# Patient Record
Sex: Male | Born: 1975 | Race: White | Hispanic: No | Marital: Single
Health system: Southern US, Community
[De-identification: ages and names within clinical notes are randomized; demographics above are authoritative.]

---

## 2014-06-26 ENCOUNTER — Emergency Department: Payer: Self-pay | Admitting: Emergency Medicine

## 2014-07-06 ENCOUNTER — Ambulatory Visit: Payer: Self-pay | Admitting: Orthopedic Surgery

## 2014-08-30 ENCOUNTER — Encounter
Admit: 2014-08-30 | Disposition: A | Discharge status: Home / Self Care | Payer: Self-pay | Attending: Orthopedic Surgery | Admitting: Orthopedic Surgery

## 2014-09-19 NOTE — Op Note (Signed)
PATIENT NAME:  Daniel Webb, Nilson M MR#:  [id number] DATE OF BIRTH:  Sep 01, 1975  DATE OF PROCEDURE:  07/06/2014  PREOPERATIVE DIAGNOSIS: Right long finger proximal phalanx fracture, comminuted, displaced.   POSTOPERATIVE DIAGNOSIS: Right long finger proximal phalanx fracture, comminuted, displaced, intra-articular.   PROCEDURE: Open reduction internal fixation, right long finger proximal phalanx.   ANESTHESIA: General.   SURGEON: Leitha SchullerMichael J. Lavone Barrientes, MD   DESCRIPTION OF PROCEDURE: The patient was brought to the operating room and after adequate general anesthesia was obtained, the right arm was prepped and draped in the usual sterile fashion with a tourniquet applied to the upper arm. After patient identification and timeout procedures were completed, the tourniquet was raised and a curvilinear incision was made over the proximal phalanx and MCP joints. The extensor tendon and hood were incised in the midline to expose the fracture. The fracture had additional fracture line not evident on preoperative x-rays. The dorsal proximal fragment had a fracture at the base with just a small metaphyseal piece associated with the articular surface. The fracture was exposed and clot removed. The fracture had a rotational deformity that was quite difficult to get a perfect reduction. It took several attempts at getting reduction. Eventually, with adequate reduction, reduction clamp was applied and a K wire inserted. Subsequently, 2 K wires to maintain alignment with acceptable position of the fragments. A small T-plate from the Biomet hand tray, 1.5 mm, was then applied. The proximal and distal screws were placed and then sequential filling of the locking screws gave essentially anatomic alignment in all planes with less than a millimeter of displacement in any direction. After the plate was applied and K wires had been removed, the finger appeared to have appropriate rotation and longitudinal orientation. The joint  surface appeared intact. The wound was thoroughly irrigated. The extensor hood was repaired using a running 4-0 Ethibond. The skin was closed with simple interrupted 4-0 nylon. A digital block with 20 mL of 0.5% Sensorcaine was given. The wound was then dressed with Xeroform, 4 x 4's, fluffs for a dressing, and then the hand was splinted with a dorsal splint holding the MCP joint in flexion of all fingers along with flexion immediately postoperative.  Tourniquet was let down. The patient was sent to the recovery room in stable condition.   ESTIMATED BLOOD LOSS: Minimal.   COMPLICATIONS: None.   SPECIMEN: None.   IMPLANTS: Biomet 1.5 mm locking hand small T-plate with multiple screws.   TOURNIQUET TIME: 99 minutes at 250 mmHg.    ____________________________ Leitha SchullerMichael J. Londa Mackowski, MD mjm:bm D: 07/06/2014 19:21:42 ET T: 07/07/2014 01:50:19 ET JOB#: [id number]  cc: Leitha SchullerMichael J. Haivyn Oravec, MD, <Dictator> Leitha SchullerMICHAEL J Woodrow Dulski MD ELECTRONICALLY SIGNED 07/07/2014 8:34

## 2014-09-23 ENCOUNTER — Encounter: Payer: Self-pay | Admitting: Occupational Therapy

## 2014-09-23 ENCOUNTER — Ambulatory Visit: Payer: No Typology Code available for payment source | Attending: Orthopedic Surgery | Admitting: Occupational Therapy

## 2014-09-23 DIAGNOSIS — R279 Unspecified lack of coordination: Secondary | ICD-10-CM | POA: Diagnosis not present

## 2014-09-23 DIAGNOSIS — R609 Edema, unspecified: Secondary | ICD-10-CM | POA: Insufficient documentation

## 2014-09-23 DIAGNOSIS — M79641 Pain in right hand: Secondary | ICD-10-CM | POA: Diagnosis not present

## 2014-09-23 DIAGNOSIS — M25641 Stiffness of right hand, not elsewhere classified: Secondary | ICD-10-CM | POA: Insufficient documentation

## 2014-09-23 DIAGNOSIS — M6281 Muscle weakness (generalized): Secondary | ICD-10-CM

## 2014-09-23 NOTE — Patient Instructions (Signed)
Pt was ed on HEP - for PROM of PIP extention , AROM into table , rolling putty and donut for digits extnetion  PROM of MC flexion , PROM dip/pip hold, PROM composite flexion  PUtty green full fist   Scar pad for night time - Cica care  Coban for ngiht time 3rd digit -to decrease edema  Provided silicon digi sleeve for daytime use of 3rd digit to decrease scar - and increase flexion of PIP  Buddy strap during day - alternate with 2nd and 4th digits  Scar massage - and use piece of coban for extra traction on scar

## 2014-09-23 NOTE — Therapy (Signed)
[facility] [address] [place], [place], [address] Phone: [phone]   Fax:  [phone]  Occupational Therapy Treatment  Patient Details  Name: Daniel MassyGregory Matthew Webb MRN: [id number] Date of Birth: [birth year] Referring Provider:  Kennedy BuckerMenz, Michael, MD  Encounter Date: 09/23/2014      OT End of Session - 09/23/14 1830    Visit Number 4   Number of Visits 12   Date for OT Re-Evaluation 10/11/14   Authorization Type [employer]   OT Start Time 1603   OT Stop Time 1643   OT Time Calculation (min) 40 min   Activity Tolerance Patient tolerated treatment well      History reviewed. No pertinent past medical history.  History reviewed. No pertinent past surgical history.  There were no vitals filed for this visit.  Visit Diagnosis:  Pain of right hand  Stiffness of finger joint, right  Muscle weakness      Subjective Assessment - 09/23/14 1611    Subjective  Still cannot make tight fist - as the day goes it get looser - I forgot my goodies so could not work as good with the scar and exercises- forgot the paper too at home -worked out of town            [facility] - 09/23/14 0001    Right Hand AROM   R Long  MCP 0-90 85 Degrees   R Long PIP 0-100 80 Degrees                  OT Treatments/Exercises (OP) - 09/23/14 0001    Hand Exercises   Other Hand Exercises PROM of MC flexion , PROM of PIP , PROM composite fist , PUtty green end range in palm; Extention of PIP on table , rolling putty, donut for stregnthening using thumb adn 2nd , 3rd digit   Ultrasound   Ultrasound Location Dorsal MC and proximal    Ultrasound Parameters 3.53mhz, cont, 0.8 intensity   Ultrasound Goals Other (Comment)  Scar   RUE Paraffin   Number Minutes Paraffin 10 Minutes   RUE Paraffin Location Hand   Comments 3rd digit coban flexion wrap  with heatingpad around    Manual Therapy   Manual Therapy Other  (comment)   Other Manual Therapy Scar mobs done [facility] and proximal phlanges - vibration and xtractor with AROM of 3rd digit -                OT Education - 09/23/14 1830    Education provided Yes   Person(s) Educated Patient   Methods Explanation;Demonstration;Verbal cues;Handout   Comprehension Returned demonstration;Verbalized understanding          OT Short Term Goals - 09/23/14 1837    OT SHORT TERM GOAL #1   Title Pt will demonstrate independence home exercise program as seen/demonstrated in clinic right long finger    Time 2   Period Weeks   Status Achieved   OT SHORT TERM GOAL #2   Title Pt will be able to perform composite fist to with in .5cm or less of [facility] for functional/work/leisure related activities right hand/digits    Time 4   Period Weeks   Status On-going   OT SHORT TERM GOAL #3   Title Pt will demonstrate >10% increase in active digital extension/flexion right long finger PIP/DIP joints for ADL/work tasks    Time 4   Period Weeks  Status On-going           OT Long Term Goals - 09/23/14 1840    OT LONG TERM GOAL #1   Title . Pt will report right hand pain 2 or less for homemaking/work/leisure activities (holding a hammer, ADL's, playing bass   Time 4   Period Weeks   Status On-going   OT LONG TERM GOAL #2   Title Patient will demonstrate increased right grip strength for ADL's and functional/work activities as seen by increased grip strength by 5# or greater w/o increased symptoms/ difficulty    Time 4   Period Weeks   Status On-going               Plan - 09/23/14 1833    Clinical Impression Statement Pt did not show great progress this session secondary to scar tissue limiting his ROM at 3rd MC and PIP flexion - pt forgot HEP stuff at home and worked out of town this past week - pt ed on scar massage and DIP/PIP stretch - to focus on that    Pt will benefit from skilled therapeutic intervention in order to  improve on the following deficits (Retired) Impaired flexibility;Pain;Decreased strength;Decreased scar mobility;Decreased range of motion   Rehab Potential Good   OT Frequency 1x / week   OT Duration 6 weeks   OT Treatment/Interventions Self-care/ADL training;Ultrasound;Parrafin;Therapeutic exercise;Manual Therapy;Scar mobilization;Passive range of motion;Patient/family education   Plan Assess scar and PIP flexion and extention of 3rd digit    Consulted and Agree with Plan of Care Patient        Problem List There are no active problems to display for this patient.   Daniel Webb, Clarissa Laird OTR/L, CLT 09/23/2014, 6:46 PM  [place] [facility] [address] [place], [place], [address] Phone: [phone]   Fax:  [phone]

## 2014-09-30 ENCOUNTER — Ambulatory Visit: Payer: No Typology Code available for payment source | Admitting: Occupational Therapy

## 2014-09-30 DIAGNOSIS — M6281 Muscle weakness (generalized): Secondary | ICD-10-CM

## 2014-09-30 DIAGNOSIS — M25641 Stiffness of right hand, not elsewhere classified: Secondary | ICD-10-CM

## 2014-09-30 DIAGNOSIS — M79641 Pain in right hand: Secondary | ICD-10-CM

## 2014-09-30 NOTE — Patient Instructions (Signed)
Upgrade to dark blue putty except to do donut for digits extention with green putty still   DIP/PIP strap wear 2 x 1 min prior to ROM to increase PIP flexion

## 2014-09-30 NOTE — Therapy (Signed)
[facility] [address] [place], [place], [address] Phone: [phone]   Fax:  [phone]  Occupational Therapy Treatment  Patient Details  Name: Arnetha MassyGregory Matthew Zalenski MRN: [id number] Date of Birth: 11/28/1975 Referring Provider:  Kennedy BuckerMenz, Michael, MD  Encounter Date: 09/30/2014      OT End of Session - 09/30/14 1742    Visit Number 5   Number of Visits 12   Date for OT Re-Evaluation 10/11/14   Authorization Type [employer]   OT Start Time 1604   OT Stop Time 1638   OT Time Calculation (min) 34 min   Activity Tolerance Patient tolerated treatment well   Behavior During Therapy [facility] for tasks assessed/performed      No past medical history on file.  No past surgical history on file.  There were no vitals filed for this visit.  Visit Diagnosis:  Pain of right hand  Stiffness of finger joint, right  Muscle weakness      Subjective Assessment - 09/30/14 1734    Subjective  I worked it this week the scar and the fisting - I think it is better -still stiff but looks tighter fist - now straigthen not improving  as much - I checked my insurance and I have a high deductible to meet - so don't know what you think    Patient Stated Goals Want to get my hand back to how it was prior to fracture - more strenght and making fist             [facility] OT Assessment - 09/30/14 0001    Strength   Grip (lbs) 86   Right Hand Lateral Pinch 20 lbs   Right Hand 3 Point Pinch 27 lbs   Grip (lbs) 91   Left Hand Lateral Pinch 18 lbs   Left Hand 3 Point Pinch 26 lbs   Right Hand AROM   R Long  MCP 0-90 88 Degrees   R Long PIP 0-100 88 Degrees                  OT Treatments/Exercises (OP) - 09/30/14 0001    Hand Exercises   Other Hand Exercises PROM of 3rd MC flexion , PIP flexion, PROM composite fist/flexion of 3rd ,  upgrade to dark blue putty for gripping , pinching , also cont with PROM and AROM for PIP  extnetion , androlling putty , but donut for digits extention with green still    Other Hand Exercises DIP/PIP strap fabricated to wear 2 x 1min prior ot ROM to increase PIP flexion during composite   RUE Paraffin   Number Minutes Paraffin 10 Minutes   RUE Paraffin Location Hand   Comments DIP/PIP strap done with coban with heatinpad around to increase ROM    Manual Therapy   Other Manual Therapy Scar mobs done at dorsal [facility] and proximal phlanges - vibration and xtractor with AROM of 3rd digit -provided 3 digisleeves for compression and scar - and new cica scar pad for night time                 OT Education - 09/30/14 1740    Education provided Yes   Education Details see pt instruction and treatment note   Person(s) Educated Patient   Methods Explanation;Demonstration;Verbal cues   Comprehension Verbalized understanding;Verbal cues required;Returned demonstration          OT Short Term Goals - 09/23/14 1837  OT SHORT TERM GOAL #1   Title Pt will demonstrate independence home exercise program as seen/demonstrated in clinic right long finger    Time 2   Period Weeks   Status Achieved   OT SHORT TERM GOAL #2   Title Pt will be able to perform composite fist to with in .5cm or less of [facility] for functional/work/leisure related activities right hand/digits    Time 4   Period Weeks   Status On-going   OT SHORT TERM GOAL #3   Title Pt will demonstrate >10% increase in active digital extension/flexion right long finger PIP/DIP joints for ADL/work tasks    Time 4   Period Weeks   Status On-going           OT Long Term Goals - 09/23/14 1840    OT LONG TERM GOAL #1   Title . Pt will report right hand pain 2 or less for homemaking/work/leisure activities (holding a hammer, ADL's, playing bass   Time 4   Period Weeks   Status On-going   OT LONG TERM GOAL #2   Title Patient will demonstrate increased right grip strength for ADL's and functional/work activities  as seen by increased grip strength by 5# or greater w/o increased symptoms/ difficulty    Time 4   Period Weeks   Status On-going               Plan - 09/30/14 1743    Clinical Impression Statement [facility] and proximal phalanges - still adhere but moving better than last time and increaes flexion at PIP and MC of 3rd coming in - increase grip and prehension this date compare to evaluation date, still some edema in PIP and fitted with new compression  sleeves and scar pad to use at home as well as DIP /PIP strap to increase flexion at PIP     Pt will benefit from skilled therapeutic intervention in order to improve on the following deficits (Retired) Impaired flexibility;Pain;Decreased strength;Decreased scar mobility;Decreased range of motion   Rehab Potential Good   OT Frequency Biweekly   OT Duration 4 weeks   Plan Assess flexion of MC and PIP as well as scar and extention of PIP    OT Home Exercise Plan See pt instruction   Consulted and Agree with Plan of Care Patient        Problem List There are no active problems to display for this patient.   Oletta CohnuPreez, Makynzie Dobesh  OTR/L ,CLT 09/30/2014, 5:47 PM  [place] [facility] [address] [place], [place], [address] Phone: [phone]   Fax:  [phone]

## 2014-10-07 ENCOUNTER — Encounter: Payer: No Typology Code available for payment source | Admitting: Occupational Therapy

## 2014-10-14 ENCOUNTER — Ambulatory Visit: Payer: No Typology Code available for payment source | Admitting: Occupational Therapy
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
